# Patient Record
Sex: Female | Born: 1955 | Race: Black or African American | Hispanic: No | Marital: Single | Smoking: Current every day smoker
Health system: Southern US, Community
[De-identification: ages and names within clinical notes are randomized; demographics above are authoritative.]

## PROBLEM LIST (undated history)

## (undated) DIAGNOSIS — I10 Essential (primary) hypertension: Secondary | ICD-10-CM

---

## 2005-02-12 ENCOUNTER — Ambulatory Visit: Payer: Self-pay | Admitting: General Practice

## 2006-01-14 ENCOUNTER — Ambulatory Visit: Payer: Self-pay | Admitting: Family Medicine

## 2007-01-27 ENCOUNTER — Ambulatory Visit: Payer: Self-pay | Admitting: Family Medicine

## 2008-04-12 ENCOUNTER — Ambulatory Visit: Payer: Self-pay | Admitting: Unknown Physician Specialty

## 2008-04-26 ENCOUNTER — Ambulatory Visit: Payer: Self-pay

## 2008-05-12 ENCOUNTER — Ambulatory Visit: Payer: Self-pay | Admitting: Internal Medicine

## 2009-08-06 ENCOUNTER — Ambulatory Visit: Payer: Self-pay

## 2010-08-08 ENCOUNTER — Ambulatory Visit: Payer: Self-pay

## 2010-08-17 ENCOUNTER — Ambulatory Visit: Payer: Self-pay | Admitting: Internal Medicine

## 2010-08-19 ENCOUNTER — Emergency Department: Payer: Self-pay | Admitting: Emergency Medicine

## 2011-08-19 ENCOUNTER — Ambulatory Visit: Payer: Self-pay

## 2012-08-19 ENCOUNTER — Ambulatory Visit: Payer: Self-pay

## 2013-07-01 ENCOUNTER — Ambulatory Visit: Payer: Self-pay | Admitting: Unknown Physician Specialty

## 2013-07-04 LAB — PATHOLOGY REPORT

## 2013-09-20 ENCOUNTER — Ambulatory Visit: Payer: Self-pay

## 2013-12-28 ENCOUNTER — Encounter: Payer: Self-pay | Admitting: Podiatry

## 2013-12-28 ENCOUNTER — Ambulatory Visit (INDEPENDENT_AMBULATORY_CARE_PROVIDER_SITE_OTHER): Payer: BC Managed Care – PPO | Admitting: Podiatry

## 2013-12-28 VITALS — BP 112/69 | HR 93 | Resp 16 | Ht 66.0 in | Wt 174.0 lb

## 2013-12-28 DIAGNOSIS — M79609 Pain in unspecified limb: Secondary | ICD-10-CM

## 2013-12-28 DIAGNOSIS — B351 Tinea unguium: Secondary | ICD-10-CM

## 2013-12-28 DIAGNOSIS — M79676 Pain in unspecified toe(s): Secondary | ICD-10-CM

## 2013-12-28 NOTE — Progress Notes (Signed)
She presents today with a chief complaint of painful elongated toenails.  Objective: Nails are thick yellow dystrophic onychomycotic and painful palpation.  Assessment: Pain in limb secondary onychomycosis 1-5 bilateral.  Plan: Debridement of nails 1 through 5 bilateral covered service secondary to pain.

## 2014-06-05 ENCOUNTER — Ambulatory Visit (INDEPENDENT_AMBULATORY_CARE_PROVIDER_SITE_OTHER): Payer: BLUE CROSS/BLUE SHIELD | Admitting: Podiatry

## 2014-06-05 ENCOUNTER — Encounter: Payer: Self-pay | Admitting: Podiatry

## 2014-06-05 VITALS — BP 105/76 | HR 84 | Resp 12

## 2014-06-05 DIAGNOSIS — B351 Tinea unguium: Secondary | ICD-10-CM

## 2014-06-05 DIAGNOSIS — M79676 Pain in unspecified toe(s): Secondary | ICD-10-CM

## 2014-06-05 NOTE — Progress Notes (Signed)
She presents today with chief complaint of painful elongated toenails.  Objective: Pulses are palpable bilateral. Nails are thick yellow dystrophic with mycotic and painful palpation.  Assessment: Pain in limb segment onychomycosis 1 through 5 bilateral.  Plan: Debridement of nails 1 through 5 bilateral.

## 2014-07-05 DIAGNOSIS — F17200 Nicotine dependence, unspecified, uncomplicated: Secondary | ICD-10-CM | POA: Insufficient documentation

## 2014-07-05 DIAGNOSIS — R7989 Other specified abnormal findings of blood chemistry: Secondary | ICD-10-CM | POA: Insufficient documentation

## 2014-07-05 DIAGNOSIS — E559 Vitamin D deficiency, unspecified: Secondary | ICD-10-CM | POA: Insufficient documentation

## 2014-08-28 ENCOUNTER — Ambulatory Visit (INDEPENDENT_AMBULATORY_CARE_PROVIDER_SITE_OTHER): Payer: BLUE CROSS/BLUE SHIELD | Admitting: Podiatry

## 2014-08-28 DIAGNOSIS — M79676 Pain in unspecified toe(s): Secondary | ICD-10-CM

## 2014-08-28 DIAGNOSIS — M7752 Other enthesopathy of left foot: Secondary | ICD-10-CM | POA: Diagnosis not present

## 2014-08-28 DIAGNOSIS — B351 Tinea unguium: Secondary | ICD-10-CM | POA: Diagnosis not present

## 2014-08-28 NOTE — Progress Notes (Signed)
She presents today with chief complaint of painful elongated toenails.  Objective: Pulses are palpable bilateral. Nails are thick yellow dystrophic with mycotic and painful palpation.  Assessment: Pain in limb segment onychomycosis 1 through 5 bilateral.  Plan: Debridement of nails 1 through 5 bilateral.

## 2014-10-30 ENCOUNTER — Other Ambulatory Visit: Payer: Self-pay | Admitting: Obstetrics and Gynecology

## 2014-10-30 DIAGNOSIS — Z1231 Encounter for screening mammogram for malignant neoplasm of breast: Secondary | ICD-10-CM

## 2014-11-01 ENCOUNTER — Ambulatory Visit
Admission: RE | Admit: 2014-11-01 | Discharge: 2014-11-01 | Disposition: A | Discharge status: Home / Self Care | Payer: BLUE CROSS/BLUE SHIELD | Source: Ambulatory Visit | Attending: Obstetrics and Gynecology | Admitting: Obstetrics and Gynecology

## 2014-11-01 DIAGNOSIS — Z1231 Encounter for screening mammogram for malignant neoplasm of breast: Secondary | ICD-10-CM | POA: Insufficient documentation

## 2015-07-04 ENCOUNTER — Ambulatory Visit (INDEPENDENT_AMBULATORY_CARE_PROVIDER_SITE_OTHER): Payer: BLUE CROSS/BLUE SHIELD | Admitting: Podiatry

## 2015-07-04 ENCOUNTER — Encounter: Payer: Self-pay | Admitting: Podiatry

## 2015-07-04 DIAGNOSIS — B351 Tinea unguium: Secondary | ICD-10-CM

## 2015-07-04 DIAGNOSIS — M79676 Pain in unspecified toe(s): Secondary | ICD-10-CM | POA: Diagnosis not present

## 2015-07-04 DIAGNOSIS — E785 Hyperlipidemia, unspecified: Secondary | ICD-10-CM | POA: Insufficient documentation

## 2015-07-04 NOTE — Progress Notes (Signed)
She presents today after having not been here for 1 year. Complaining of painful elongated toenails 1 through 5 bilateral.  Objective: Vital signs are stable alert and oriented 3. Pulses are strongly palpable. Neurologic sensorium is intact. Toenails are thick yellow dystrophic severely elongated painful on palpation as well as debridement.  Assessment: Pain in limb secondary to onychomycosis.  Plan: Debridement of toenails 1 through 5 bilateral.

## 2015-11-07 ENCOUNTER — Encounter: Payer: BLUE CROSS/BLUE SHIELD | Admitting: Podiatry

## 2015-11-07 ENCOUNTER — Encounter: Payer: Self-pay | Admitting: Podiatry

## 2015-11-07 NOTE — Progress Notes (Signed)
This encounter was created in error - please disregard.

## 2015-11-21 ENCOUNTER — Other Ambulatory Visit: Payer: Self-pay | Admitting: Internal Medicine

## 2015-11-21 DIAGNOSIS — Z1231 Encounter for screening mammogram for malignant neoplasm of breast: Secondary | ICD-10-CM

## 2015-11-28 ENCOUNTER — Ambulatory Visit
Admission: RE | Admit: 2015-11-28 | Discharge: 2015-11-28 | Disposition: A | Discharge status: Home / Self Care | Payer: BLUE CROSS/BLUE SHIELD | Source: Ambulatory Visit | Attending: Internal Medicine | Admitting: Internal Medicine

## 2015-11-28 ENCOUNTER — Other Ambulatory Visit: Payer: Self-pay | Admitting: Internal Medicine

## 2015-11-28 DIAGNOSIS — Z1231 Encounter for screening mammogram for malignant neoplasm of breast: Secondary | ICD-10-CM

## 2015-12-31 DIAGNOSIS — J302 Other seasonal allergic rhinitis: Secondary | ICD-10-CM | POA: Insufficient documentation

## 2016-06-23 ENCOUNTER — Ambulatory Visit (INDEPENDENT_AMBULATORY_CARE_PROVIDER_SITE_OTHER): Payer: BLUE CROSS/BLUE SHIELD | Admitting: Podiatry

## 2016-06-23 DIAGNOSIS — B351 Tinea unguium: Secondary | ICD-10-CM

## 2016-06-23 DIAGNOSIS — M79676 Pain in unspecified toe(s): Secondary | ICD-10-CM | POA: Diagnosis not present

## 2016-06-23 NOTE — Progress Notes (Signed)
She presents today to complaint of painful elongated toenails.  Objective: The nose along thick yellow dystrophic onychomycotic and painful palpation.  Assessment: Limb segment onychomycosis.  Plan: Debridement of toenails 1 through 5 bilateral.

## 2016-10-17 ENCOUNTER — Other Ambulatory Visit: Payer: Self-pay | Admitting: Internal Medicine

## 2016-10-27 ENCOUNTER — Ambulatory Visit (INDEPENDENT_AMBULATORY_CARE_PROVIDER_SITE_OTHER): Payer: BLUE CROSS/BLUE SHIELD | Admitting: Podiatry

## 2016-10-27 ENCOUNTER — Encounter: Payer: Self-pay | Admitting: Podiatry

## 2016-10-27 DIAGNOSIS — M79676 Pain in unspecified toe(s): Secondary | ICD-10-CM | POA: Diagnosis not present

## 2016-10-27 DIAGNOSIS — B351 Tinea unguium: Secondary | ICD-10-CM

## 2016-10-27 NOTE — Progress Notes (Signed)
She presents today to complaint of painful elongated toenails. She is also complaining of pain to her left heel.  Objective: Vital signs are stable she's alert and oriented 3. Pulses are palpable. No calf pain. She has pain on palpation medial calcaneal tubercle of the left heel. Toenails are long thick yellow dystrophic onychomycotic.  Assessment: Pain and limp secondary to onychomycosis and plantar fasciitis.  Plan: I offered her an injection for the plantar fascia pain however she declined. Debridement of toenails 1 through 5 bilateral. Follow up with her in 6 months

## 2017-01-06 ENCOUNTER — Other Ambulatory Visit: Payer: Self-pay | Admitting: Internal Medicine

## 2017-01-06 DIAGNOSIS — Z1231 Encounter for screening mammogram for malignant neoplasm of breast: Secondary | ICD-10-CM

## 2017-02-10 ENCOUNTER — Ambulatory Visit
Admission: RE | Admit: 2017-02-10 | Discharge: 2017-02-10 | Disposition: A | Discharge status: Home / Self Care | Payer: BLUE CROSS/BLUE SHIELD | Source: Ambulatory Visit | Attending: Internal Medicine | Admitting: Internal Medicine

## 2017-02-10 DIAGNOSIS — Z1231 Encounter for screening mammogram for malignant neoplasm of breast: Secondary | ICD-10-CM | POA: Insufficient documentation

## 2017-04-22 ENCOUNTER — Ambulatory Visit (INDEPENDENT_AMBULATORY_CARE_PROVIDER_SITE_OTHER): Payer: BLUE CROSS/BLUE SHIELD | Admitting: Podiatry

## 2017-04-22 ENCOUNTER — Encounter: Payer: Self-pay | Admitting: Podiatry

## 2017-04-22 DIAGNOSIS — M79676 Pain in unspecified toe(s): Secondary | ICD-10-CM | POA: Diagnosis not present

## 2017-04-22 DIAGNOSIS — B351 Tinea unguium: Secondary | ICD-10-CM | POA: Diagnosis not present

## 2017-04-22 DIAGNOSIS — M722 Plantar fascial fibromatosis: Secondary | ICD-10-CM

## 2017-04-22 NOTE — Progress Notes (Signed)
She presents today chief complaint of pain to her right heel.  She is also complaining of painful elongated toenails.  Objective: Vital signs are stable she is alert and oriented x3 toenails are long thick yellow dystrophic clinically mycotic and painful on palpation.  She has pain to palpation medial calcaneal tubercle of the right heel.  No open lesions or wounds are noted.  Assessment: Pain in limb secondary to onychomycosis and plantar fasciitis.  Plan: Injected the right heel today after verbal permission with 20 mg of Kenalog and 5 mg of Marcaine after sterile Betadine skin prep to the medial aspect of the right heel tolerated procedure well with no complications.  I also debrided toenails 1 through 5 bilateral cover service secondary to pain.  Follow-up with her as needed.

## 2017-04-29 ENCOUNTER — Ambulatory Visit: Payer: BLUE CROSS/BLUE SHIELD | Admitting: Podiatry

## 2017-12-08 ENCOUNTER — Ambulatory Visit: Payer: BLUE CROSS/BLUE SHIELD | Admitting: Podiatry

## 2017-12-28 ENCOUNTER — Ambulatory Visit (INDEPENDENT_AMBULATORY_CARE_PROVIDER_SITE_OTHER): Payer: BLUE CROSS/BLUE SHIELD | Admitting: Podiatry

## 2017-12-28 ENCOUNTER — Encounter: Payer: Self-pay | Admitting: Podiatry

## 2017-12-28 DIAGNOSIS — B351 Tinea unguium: Secondary | ICD-10-CM

## 2017-12-28 DIAGNOSIS — M79676 Pain in unspecified toe(s): Secondary | ICD-10-CM | POA: Diagnosis not present

## 2017-12-28 NOTE — Progress Notes (Signed)
She presents today chief concern of painful elongated toenails.  Objective: Toenails are long thick yellow dystrophic with mycotic pulses are palpable.  No open lesions or wounds.  No erythema edema cellulitis drainage or odor.  Assessment: Pain in limb secondary to onychomycosis.  Plan: Debridement of toenails 1 through 5 bilateral.

## 2018-01-04 ENCOUNTER — Other Ambulatory Visit: Payer: Self-pay | Admitting: Internal Medicine

## 2018-01-04 DIAGNOSIS — Z1231 Encounter for screening mammogram for malignant neoplasm of breast: Secondary | ICD-10-CM

## 2018-02-18 ENCOUNTER — Ambulatory Visit
Admission: RE | Admit: 2018-02-18 | Discharge: 2018-02-18 | Disposition: A | Discharge status: Home / Self Care | Payer: BLUE CROSS/BLUE SHIELD | Source: Ambulatory Visit | Attending: Internal Medicine | Admitting: Internal Medicine

## 2018-02-18 DIAGNOSIS — Z1231 Encounter for screening mammogram for malignant neoplasm of breast: Secondary | ICD-10-CM | POA: Insufficient documentation

## 2018-04-26 ENCOUNTER — Ambulatory Visit: Payer: BLUE CROSS/BLUE SHIELD | Admitting: Podiatry

## 2018-09-20 ENCOUNTER — Encounter: Payer: Self-pay | Admitting: Podiatry

## 2018-09-20 ENCOUNTER — Other Ambulatory Visit: Payer: Self-pay

## 2018-09-20 ENCOUNTER — Ambulatory Visit: Payer: BLUE CROSS/BLUE SHIELD

## 2018-09-20 ENCOUNTER — Ambulatory Visit (INDEPENDENT_AMBULATORY_CARE_PROVIDER_SITE_OTHER): Payer: BLUE CROSS/BLUE SHIELD | Admitting: Podiatry

## 2018-09-20 VITALS — Temp 98.5°F

## 2018-09-20 DIAGNOSIS — Q828 Other specified congenital malformations of skin: Secondary | ICD-10-CM | POA: Diagnosis not present

## 2018-09-20 DIAGNOSIS — M79676 Pain in unspecified toe(s): Secondary | ICD-10-CM | POA: Diagnosis not present

## 2018-09-20 DIAGNOSIS — M722 Plantar fascial fibromatosis: Secondary | ICD-10-CM

## 2018-09-20 DIAGNOSIS — B351 Tinea unguium: Secondary | ICD-10-CM | POA: Diagnosis not present

## 2018-09-20 NOTE — Progress Notes (Signed)
She presents today with a chief complaint of painfully elongated toenails and multiple calluses to the plantar aspect of the right foot.  Objective: Vital signs are stable she is alert and oriented x3.  Pulses are palpable.  No open lesions or wounds multiple porokeratotic lesions along the plantar lateral arch and forefoot right primarily.  Nails are thick yellow dystrophic onychomycotic painfully ingrown.  Assessment: Porokeratosis right foot.  Pain in limb secondary to onychomycosis.  Plan: Debridement of toenails and all porokeratotic tissue bilaterally.  Follow-up with me as needed.

## 2018-09-30 DIAGNOSIS — Z86010 Personal history of colonic polyps: Secondary | ICD-10-CM | POA: Insufficient documentation

## 2018-11-01 ENCOUNTER — Emergency Department
Admission: EM | Admit: 2018-11-01 | Discharge: 2018-11-01 | Disposition: A | Discharge status: Home / Self Care | Payer: BC Managed Care – PPO | Attending: Emergency Medicine | Admitting: Emergency Medicine

## 2018-11-01 ENCOUNTER — Emergency Department: Payer: BC Managed Care – PPO

## 2018-11-01 ENCOUNTER — Other Ambulatory Visit: Payer: Self-pay

## 2018-11-01 ENCOUNTER — Encounter: Payer: Self-pay | Admitting: Emergency Medicine

## 2018-11-01 DIAGNOSIS — Z7982 Long term (current) use of aspirin: Secondary | ICD-10-CM | POA: Diagnosis not present

## 2018-11-01 DIAGNOSIS — Z79899 Other long term (current) drug therapy: Secondary | ICD-10-CM | POA: Insufficient documentation

## 2018-11-01 DIAGNOSIS — F17210 Nicotine dependence, cigarettes, uncomplicated: Secondary | ICD-10-CM | POA: Insufficient documentation

## 2018-11-01 DIAGNOSIS — H5461 Unqualified visual loss, right eye, normal vision left eye: Secondary | ICD-10-CM | POA: Insufficient documentation

## 2018-11-01 DIAGNOSIS — E119 Type 2 diabetes mellitus without complications: Secondary | ICD-10-CM | POA: Insufficient documentation

## 2018-11-01 DIAGNOSIS — H538 Other visual disturbances: Secondary | ICD-10-CM | POA: Diagnosis present

## 2018-11-01 HISTORY — DX: Essential (primary) hypertension: I10

## 2018-11-01 LAB — DIFFERENTIAL
Abs Immature Granulocytes: 0.02 10*3/uL (ref 0.00–0.07)
Basophils Absolute: 0 10*3/uL (ref 0.0–0.1)
Basophils Relative: 1 %
Eosinophils Absolute: 0.2 10*3/uL (ref 0.0–0.5)
Eosinophils Relative: 3 %
Immature Granulocytes: 0 %
Lymphocytes Relative: 37 %
Lymphs Abs: 2.8 10*3/uL (ref 0.7–4.0)
Monocytes Absolute: 0.5 10*3/uL (ref 0.1–1.0)
Monocytes Relative: 7 %
Neutro Abs: 3.9 10*3/uL (ref 1.7–7.7)
Neutrophils Relative %: 52 %

## 2018-11-01 LAB — CBC
HCT: 43 % (ref 36.0–46.0)
Hemoglobin: 14.8 g/dL (ref 12.0–15.0)
MCH: 30.2 pg (ref 26.0–34.0)
MCHC: 34.4 g/dL (ref 30.0–36.0)
MCV: 87.8 fL (ref 80.0–100.0)
Platelets: 215 10*3/uL (ref 150–400)
RBC: 4.9 MIL/uL (ref 3.87–5.11)
RDW: 12.7 % (ref 11.5–15.5)
WBC: 7.4 10*3/uL (ref 4.0–10.5)
nRBC: 0 % (ref 0.0–0.2)

## 2018-11-01 LAB — GLUCOSE, CAPILLARY: Glucose-Capillary: 109 mg/dL — ABNORMAL HIGH (ref 70–99)

## 2018-11-01 LAB — COMPREHENSIVE METABOLIC PANEL
ALT: 19 U/L (ref 0–44)
AST: 23 U/L (ref 15–41)
Albumin: 4.3 g/dL (ref 3.5–5.0)
Alkaline Phosphatase: 61 U/L (ref 38–126)
Anion gap: 8 (ref 5–15)
BUN: 12 mg/dL (ref 6–20)
CO2: 26 mmol/L (ref 22–32)
Calcium: 9.9 mg/dL (ref 8.9–10.3)
Chloride: 109 mmol/L (ref 98–111)
Creatinine, Ser: 0.61 mg/dL (ref 0.44–1.00)
GFR calc Af Amer: >60 mL/min (ref >60)
GFR calc non Af Amer: >60 mL/min (ref >60)
Glucose, Bld: 130 mg/dL — ABNORMAL HIGH (ref 70–99)
Potassium: 3.8 mmol/L (ref 3.5–5.1)
Sodium: 143 mmol/L (ref 135–145)
Total Bilirubin: 0.4 mg/dL (ref 0.3–1.2)
Total Protein: 7.1 g/dL (ref 6.5–8.1)

## 2018-11-01 LAB — PROTIME-INR
INR: 1 (ref 0.8–1.2)
Prothrombin Time: 12.7 seconds (ref 11.4–15.2)

## 2018-11-01 LAB — APTT: aPTT: 29 seconds (ref 24–36)

## 2018-11-01 NOTE — Discharge Instructions (Addendum)
Please proceed to [facility] for further evaluation.  Please inform them that you were seen in the emergency department today and referred to the [facility] by Dr. George Ina.

## 2018-11-01 NOTE — ED Notes (Signed)
Tv turned on for patient

## 2018-11-01 NOTE — ED Notes (Signed)
Rainbow was sent.

## 2018-11-01 NOTE — ED Notes (Signed)
Urine cup with bag was given to pt.

## 2018-11-01 NOTE — ED Triage Notes (Signed)
Says has had blurred vision in right eye since Saturday night.  Says her left eye has a cataract, so she does not seee difference there..she said there were no other symptoms.

## 2018-11-01 NOTE — ED Provider Notes (Signed)
[facility] Emergency Department Provider Note  Time seen: 11:27 AM  I have reviewed the triage vital signs and the nursing notes.   HISTORY  Chief Complaint Loss of Vision   HPI Victoria Boyd is a 63 y.o. female with a past medical history of hypertension, hyperlipidemia, presents to the emergency department for decreased vision from her right eye.  According to the patient since yesterday she has had diminished vision to her right eye.  Patient describes as a falling over the right eye like she is looking through dirty eyeglasses.  Patient states the cataract to the left eye but denies any visual changes to the left eye from baseline.  Patient denies any weakness or numbness confusion or slurred speech.  Patient states she continued to have diminished vision today, had a blood pressure checked at work and it was elevated so she came to the emergency department for evaluation.  Denies any chest pain, denies any cough congestion shortness of breath or fever.   Past Medical History:  Diagnosis Date  . Hypertension     Patient Active Problem List   Diagnosis Date Noted  . Chronic seasonal allergic rhinitis 12/31/2015  . HLD (hyperlipidemia) 07/04/2015  . Vitamin D deficiency 07/05/2014  . 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 Low vitamin D level 07/05/2014  . Tobacco use disorder 07/05/2014    History reviewed. No pertinent surgical history.  Prior to Admission medications   Medication Sig Start Date End Date Taking? Authorizing Provider  aspirin EC 81 MG tablet Take by mouth.    [provider]  Cholecalciferol (VITAMIN D3) 2000 UNITS capsule Take by mouth.    [provider]  fluticasone (FLONASE) 50 MCG/ACT nasal spray Place into the nose. 09/02/13   [provider]  furosemide (LASIX) 20 MG tablet Take 20 mg by mouth daily. 09/13/18   [provider]  montelukast (SINGULAIR) 10 MG tablet Take by mouth. 03/08/14    [provider]  Multiple Vitamin (MULTI-VITAMINS) TABS Take by mouth.    [provider]  simvastatin (ZOCOR) 40 MG tablet Take by mouth. 09/02/13   [provider]    Allergies  Allergen Reactions  . Penicillins Swelling    Family History  Problem Relation Age of Onset  . Breast cancer Neg Hx     Social History Social History   Tobacco Use  . Smoking status: Current Every Day Smoker  . Smokeless tobacco: Never Used  Substance Use Topics  . Alcohol use: No  . Drug use: Not on file    Review of Systems Constitutional: Negative for fever. Eyes: Diminished vision/fog over her vision of the right eye.  Left eye cataract which is baseline for the patient. Cardiovascular: Negative for chest pain. Respiratory: Negative for shortness of breath.  Negative for cough. Gastrointestinal: Negative for abdominal pain Musculoskeletal: Negative for musculoskeletal complaints Skin: Negative for skin complaints  Neurological: Negative for headache All other ROS negative  ____________________________________________   PHYSICAL EXAM:  VITAL SIGNS: ED Triage Vitals  Enc Vitals Group     BP 11/01/18 1039 (!) 144/92     Pulse Rate 11/01/18 1039 (!) 108     Resp 11/01/18 1039 20     Temp 11/01/18 1039 98.2 F (36.8 C)     Temp Source 11/01/18 1039 Oral     SpO2 11/01/18 1039 97 %     Weight 11/01/18 1039 177 lb (80.3 kg)     Height 11/01/18 1039  5\' 6"  (1.676 m)     Head Circumference --      Peak Flow --      Pain Score 11/01/18 1042 0     Pain Loc --      Pain Edu? --      Excl. in [place]? --    Constitutional: Alert and oriented. Well appearing and in no distress. Eyes: Normal exam, extraocular muscles intact.  PERRLA.  Visual fields intact ENT      Head: Normocephalic and atraumatic.      Mouth/Throat: Mucous membranes are moist. Cardiovascular: Normal rate, regular rhythm Respiratory: Normal respiratory effort without tachypnea nor retractions.  Breath sounds are clear  Gastrointestinal: Soft and nontender. No distention.   Musculoskeletal: Nontender with normal range of motion in all extremities.  Neurologic:  Normal speech and language. No gross focal neurologic deficits.  Equal grip strength bilaterally.  5/5 motor in all extremities.  No lower or upper extremity drift Skin:  Skin is warm, dry and intact.  Psychiatric: Mood and affect are normal  ____________________________________________    EKG  EKG viewed and interpreted by myself shows a normal sinus rhythm at 97 bpm with a narrow QRS, mild left axis deviation, largely normal intervals, nonspecific but no concerning ST changes.  ____________________________________________    RADIOLOGY  CT head negative  ____________________________________________   INITIAL IMPRESSION / ASSESSMENT AND PLAN / ED COURSE  Pertinent labs & imaging results that were available during my care of the patient were reviewed by me and considered in my medical decision making (see chart for details).   Patient presents to the emergency department for decreased vision from her right eye and high blood pressure today.  Overall the patient appears well, states she can see out of the right eye but it feels like she is looking through dirty eyeglasses.  Patient states she sees [facility].  Denies any weakness or numbness confusion or slurred speech.  Neurological exam is intact.  Patient has extraocular muscles intact, PERRLA, no visual field cuts.  Patient denies any eye pain.  Differential would include cataract, lens disruption, vitreous hemorrhage, retinal detachment, less likely glaucoma given no tenderness or pain.  We will discuss with [facility] with whom the patient is already established for further recommendations and hopeful eye exam today.  Patient's overall work-up is reassuring including negative CT scan normal labs and reassuring EKG.  I discussed the patient with Dr.  George Ina of [facility].  States he believes the patient will be safe to follow-up in the office this afternoon.  Patient will go directly to [facility] office for further evaluation.  Patient feels comfortable with this plan of care.  Victoria Boyd was evaluated in Emergency Department on 11/01/2018 for the symptoms described in the history of present illness. She was evaluated in the context of the global COVID-19 pandemic, which necessitated consideration that the patient might be at risk for infection with the SARS-CoV-2 virus that causes COVID-19. Institutional protocols and algorithms that pertain to the evaluation of patients at risk for COVID-19 are in a state of rapid change based on information released by regulatory bodies including the CDC and federal and state organizations. These policies and algorithms were followed during the patient's care in the ED.  ____________________________________________   FINAL CLINICAL IMPRESSION(S) / ED DIAGNOSES  Visual deficit   Harvest Dark, MD 11/01/18 1302

## 2018-11-11 ENCOUNTER — Other Ambulatory Visit: Payer: Self-pay | Admitting: Ophthalmology

## 2018-11-11 DIAGNOSIS — H4922 Sixth [abducent] nerve palsy, left eye: Secondary | ICD-10-CM

## 2018-11-15 ENCOUNTER — Ambulatory Visit: Payer: BC Managed Care – PPO

## 2018-11-21 ENCOUNTER — Ambulatory Visit
Admission: RE | Admit: 2018-11-21 | Discharge: 2018-11-21 | Disposition: A | Discharge status: Home / Self Care | Payer: BC Managed Care – PPO | Source: Ambulatory Visit | Attending: Ophthalmology | Admitting: Ophthalmology

## 2018-11-21 DIAGNOSIS — H4922 Sixth [abducent] nerve palsy, left eye: Secondary | ICD-10-CM

## 2018-11-21 MED ORDER — GADOBUTROL 1 MMOL/ML IV SOLN
7.0000 mL | Freq: Once | INTRAVENOUS | Status: AC | PRN
  Administered 2018-11-21: 14:00:00 7 mL via INTRAVENOUS

## 2019-01-11 DIAGNOSIS — I10 Essential (primary) hypertension: Secondary | ICD-10-CM | POA: Insufficient documentation

## 2019-01-19 ENCOUNTER — Other Ambulatory Visit: Payer: Self-pay | Admitting: Internal Medicine

## 2019-01-19 DIAGNOSIS — Z1231 Encounter for screening mammogram for malignant neoplasm of breast: Secondary | ICD-10-CM

## 2019-02-21 ENCOUNTER — Other Ambulatory Visit: Payer: Self-pay

## 2019-02-21 ENCOUNTER — Ambulatory Visit
Admission: RE | Admit: 2019-02-21 | Discharge: 2019-02-21 | Disposition: A | Discharge status: Home / Self Care | Payer: BC Managed Care – PPO | Source: Ambulatory Visit | Attending: Internal Medicine | Admitting: Internal Medicine

## 2019-02-21 DIAGNOSIS — Z1231 Encounter for screening mammogram for malignant neoplasm of breast: Secondary | ICD-10-CM | POA: Diagnosis not present

## 2019-03-16 ENCOUNTER — Other Ambulatory Visit: Payer: Self-pay

## 2019-03-16 ENCOUNTER — Ambulatory Visit (INDEPENDENT_AMBULATORY_CARE_PROVIDER_SITE_OTHER): Payer: BC Managed Care – PPO | Admitting: Podiatry

## 2019-03-16 DIAGNOSIS — M722 Plantar fascial fibromatosis: Secondary | ICD-10-CM

## 2019-03-16 DIAGNOSIS — M79676 Pain in unspecified toe(s): Secondary | ICD-10-CM

## 2019-03-16 DIAGNOSIS — Q828 Other specified congenital malformations of skin: Secondary | ICD-10-CM | POA: Diagnosis not present

## 2019-03-16 DIAGNOSIS — B351 Tinea unguium: Secondary | ICD-10-CM

## 2019-03-16 NOTE — Progress Notes (Signed)
She presents today chief complaint of a painful heel right.  She states that she will get that shot back in March when it for started hurting but I am scared to do it.  Also need my toenails trimmed and my calluses trimmed.  Objective: Vital signs are stable alert and oriented x3.  Pulses are palpable.  She has severe pain on palpation medial located tubercle of the right heel.  No pain on medial and lateral compression of the calcaneus.  Toenails are long thick yellow dystrophic clinically mycotic painful palpation painful on debridement.  She also has multiple porokeratotic lesions the majority of which lie on the lateral aspect of the fifth metatarsal area of the right foot plantarly.  Assessment: Pain in limb secondary to plantar fasciitis, onychomycosis and poor keratomas.  Plan: Discussed etiology pathology conservative versus surgical therapies.  At this point I injected 20 mg Kenalog 5 mg Marcaine point of maximal tenderness of the right heel after sterile Betadine skin prep she tolerated procedure well without complications.  Debrided toenails 1 through 5 bilaterally.  Debrided all hypertrophic hyperkeratotic tissue.  Follow-up with me as needed.

## 2019-04-13 ENCOUNTER — Encounter: Payer: Self-pay | Admitting: Podiatry

## 2019-04-13 ENCOUNTER — Other Ambulatory Visit: Payer: Self-pay

## 2019-04-13 ENCOUNTER — Ambulatory Visit (INDEPENDENT_AMBULATORY_CARE_PROVIDER_SITE_OTHER): Payer: BC Managed Care – PPO | Admitting: Podiatry

## 2019-04-13 DIAGNOSIS — M722 Plantar fascial fibromatosis: Secondary | ICD-10-CM | POA: Diagnosis not present

## 2019-04-13 NOTE — Progress Notes (Signed)
She presents today for follow-up of her bilateral heels states that the right was doing much better than it was but the left one is killing me now.  Objective: Vital signs are stable alert and oriented x3.  Pulses are palpable.  There is no erythema edema cellulitis drainage or odor she has pain on palpation medial calcaneal tubercle left greater than right.  Assessment: Pain in limb secondary to plantar fasciitis left greater than right.  Plan: After sterile Betadine skin prep I injected 20 mg Kenalog 5 mg Marcaine point of maximal tenderness bilateral heels.  She states that she was going to find some over-the-counter insoles.  She does not want custom orthotics because insurance does not pay for those.

## 2019-04-20 ENCOUNTER — Other Ambulatory Visit: Payer: Self-pay

## 2019-04-20 ENCOUNTER — Emergency Department
Admission: EM | Admit: 2019-04-20 | Discharge: 2019-04-20 | Disposition: A | Discharge status: Home / Self Care | Payer: BC Managed Care – PPO | Attending: Emergency Medicine | Admitting: Emergency Medicine

## 2019-04-20 ENCOUNTER — Encounter: Payer: Self-pay | Admitting: Medical Oncology

## 2019-04-20 ENCOUNTER — Emergency Department: Payer: BC Managed Care – PPO

## 2019-04-20 DIAGNOSIS — I10 Essential (primary) hypertension: Secondary | ICD-10-CM | POA: Diagnosis not present

## 2019-04-20 DIAGNOSIS — F17210 Nicotine dependence, cigarettes, uncomplicated: Secondary | ICD-10-CM | POA: Diagnosis not present

## 2019-04-20 DIAGNOSIS — R002 Palpitations: Secondary | ICD-10-CM

## 2019-04-20 LAB — BASIC METABOLIC PANEL
Anion gap: 10 (ref 5–15)
BUN: 17 mg/dL (ref 8–23)
CO2: 27 mmol/L (ref 22–32)
Calcium: 10.1 mg/dL (ref 8.9–10.3)
Chloride: 101 mmol/L (ref 98–111)
Creatinine, Ser: 0.74 mg/dL (ref 0.44–1.00)
GFR calc Af Amer: >60 mL/min (ref >60)
GFR calc non Af Amer: >60 mL/min (ref >60)
Glucose, Bld: 112 mg/dL — ABNORMAL HIGH (ref 70–99)
Potassium: 4 mmol/L (ref 3.5–5.1)
Sodium: 138 mmol/L (ref 135–145)

## 2019-04-20 LAB — CBC
HCT: 46.5 % — ABNORMAL HIGH (ref 36.0–46.0)
Hemoglobin: 15.9 g/dL — ABNORMAL HIGH (ref 12.0–15.0)
MCH: 29.9 pg (ref 26.0–34.0)
MCHC: 34.2 g/dL (ref 30.0–36.0)
MCV: 87.6 fL (ref 80.0–100.0)
Platelets: 249 10*3/uL (ref 150–400)
RBC: 5.31 MIL/uL — ABNORMAL HIGH (ref 3.87–5.11)
RDW: 12.5 % (ref 11.5–15.5)
WBC: 9.5 10*3/uL (ref 4.0–10.5)
nRBC: 0 % (ref 0.0–0.2)

## 2019-04-20 LAB — TROPONIN I (HIGH SENSITIVITY): Troponin I (High Sensitivity): 4 ng/L (ref <18)

## 2019-04-20 MED ORDER — LORAZEPAM 0.5 MG PO TABS: 0.5000 mg | ORAL_TABLET | Freq: Two times a day (BID) | ORAL | 0 refills | Status: AC | PRN

## 2019-04-20 NOTE — ED Notes (Signed)
Pt resting in the bed, denies need for blanket, Denies any palpitations at this time, states she had been stressed about not receiving the stimulus check yet and thinks that is when her heart began "fluttering." Bed locked and low, call light in reach.

## 2019-04-20 NOTE — ED Triage Notes (Signed)
Pt reports since last night she has been feeling a "flutter" feeling in her chest. Denies chest pain.

## 2019-04-20 NOTE — ED Provider Notes (Signed)
[facility] Emergency Department Provider Note       Time seen: ----------------------------------------- 7:01 AM on 04/20/2019 -----------------------------------------   I have reviewed the triage vital signs and the nursing notes.  HISTORY   Chief Complaint Palpitations    HPI Victoria Boyd is a 64 y.o. female with a history of hypertension, hyperlipidemia who presents to the ED for palpitations or a fluttering feeling in her chest.  Patient states she has been feeling this since last night.  She denies any pain.  She denies any recent illness.  Past Medical History:  Diagnosis Date  . Hypertension     Patient Active Problem List   Diagnosis Date Noted  . Chronic seasonal allergic rhinitis 12/31/2015  . HLD (hyperlipidemia) 07/04/2015  . Vitamin D deficiency 07/05/2014  . 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 Low vitamin D level 07/05/2014  . Tobacco use disorder 07/05/2014    History reviewed. No pertinent surgical history.  Allergies Penicillins  Social History Social History   Tobacco Use  . Smoking status: Current Every Day Smoker  . Smokeless tobacco: Never Used  Substance Use Topics  . Alcohol use: No  . Drug use: Not on file    Review of Systems Constitutional: Negative for fever. Cardiovascular: Negative for chest pain.  Positive for palpitations Respiratory: Negative for shortness of breath. Gastrointestinal: Negative for abdominal pain, vomiting and diarrhea. Musculoskeletal: Negative for back pain. Skin: Negative for rash. Neurological: Negative for headaches, focal weakness or numbness.  All systems negative/normal/unremarkable except as stated in the HPI  ____________________________________________   PHYSICAL EXAM:  VITAL SIGNS: ED Triage Vitals  Enc Vitals Group     BP 04/20/19 0634 (!) 158/99     Pulse Rate 04/20/19 0634 91     Resp 04/20/19 0634 18     Temp 04/20/19 0634 99.1 F (37.3 C)      Temp Source 04/20/19 0634 Oral     SpO2 04/20/19 0634 98 %     Weight 04/20/19 0632 176 lb 5.9 oz (80 kg)     Height 04/20/19 0632 5\' 6"  (1.676 m)     Head Circumference --      Peak Flow --      Pain Score 04/20/19 0629 0     Pain Loc --      Pain Edu? --      Excl. in [place]? --     Constitutional: Alert and oriented. Well appearing and in no distress. Eyes: Conjunctivae are normal. Normal extraocular movements. Cardiovascular: Normal rate, regular rhythm. No murmurs, rubs, or gallops. Respiratory: Normal respiratory effort without tachypnea nor retractions. Breath sounds are clear and equal bilaterally. No wheezes/rales/rhonchi. Gastrointestinal: Soft and nontender. Normal bowel sounds Musculoskeletal: Nontender with normal range of motion in extremities. No lower extremity tenderness nor edema. Neurologic:  Normal speech and language. No gross focal neurologic deficits are appreciated.  Skin:  Skin is warm, dry and intact. No rash noted. Psychiatric: Mood and affect are normal. Speech and behavior are normal.  ____________________________________________  EKG: Interpreted by me.  Sinus rhythm with rate of 85 bpm, LVH, left axis deviation, normal QT  ____________________________________________  ED COURSE:  As part of my medical decision making, I reviewed the following data within the [place] History obtained from family if available, nursing notes, old chart and ekg, as well as notes from prior ED visits. Patient presented for palpitations, we will assess with labs and imaging as indicated at this time.  Procedures  TROYLENE FRIGON was evaluated in Emergency Department on 04/20/2019 for the symptoms described in the history of present illness. She was evaluated in the context of the global COVID-19 pandemic, which necessitated consideration that the patient might be at risk for infection with the SARS-CoV-2 virus that causes COVID-19. Institutional protocols  and algorithms that pertain to the evaluation of patients at risk for COVID-19 are in a state of rapid change based on information released by regulatory bodies including the CDC and federal and state organizations. These policies and algorithms were followed during the patient's care in the ED.  ____________________________________________   LABS (pertinent positives/negatives)  Labs Reviewed  BASIC METABOLIC PANEL - Abnormal; Notable for the following components:      Result Value   Glucose, Bld 112 (*)    All other components within normal limits  CBC - Abnormal; Notable for the following components:   RBC 5.31 (*)    Hemoglobin 15.9 (*)    HCT 46.5 (*)    All other components within normal limits  TROPONIN I (HIGH SENSITIVITY)    RADIOLOGY  Chest x-ray IMPRESSION:  Negative chest.  ____________________________________________   DIFFERENTIAL DIAGNOSIS   Arrhythmia, MI, unstable angina, electrolyte abnormality  FINAL ASSESSMENT AND PLAN  Palpitations   Plan: The patient had presented for palpitations. Patient's labs are reassuring. Patient's imaging did not reveal any acute process.  Patient is under significant stress which I think plays a part in this.  Her observing her on the monitor she occasionally has a premature beat but otherwise has a normal rhythm.   Laurence Aly, MD    Note: This note was generated in part or whole with voice recognition software. Voice recognition is usually quite accurate but there are transcription errors that can and very often do occur. I apologize for any typographical errors that were not detected and corrected.     Earleen Newport, MD 04/20/19 [phone]

## 2019-04-20 NOTE — ED Notes (Signed)
Pt states she is feeling better and can walk out on her own.

## 2019-05-02 ENCOUNTER — Ambulatory Visit: Payer: BC Managed Care – PPO | Admitting: Podiatry

## 2019-05-04 ENCOUNTER — Ambulatory Visit: Payer: BC Managed Care – PPO | Admitting: Orthotics

## 2019-05-04 ENCOUNTER — Other Ambulatory Visit: Payer: Self-pay

## 2019-05-04 ENCOUNTER — Ambulatory Visit: Payer: BC Managed Care – PPO | Admitting: Podiatry

## 2019-05-04 DIAGNOSIS — B351 Tinea unguium: Secondary | ICD-10-CM

## 2019-05-04 DIAGNOSIS — M722 Plantar fascial fibromatosis: Secondary | ICD-10-CM | POA: Diagnosis not present

## 2019-05-04 DIAGNOSIS — M79676 Pain in unspecified toe(s): Secondary | ICD-10-CM

## 2019-05-04 DIAGNOSIS — Q828 Other specified congenital malformations of skin: Secondary | ICD-10-CM | POA: Diagnosis not present

## 2019-05-04 NOTE — Progress Notes (Signed)

## 2019-05-04 NOTE — Progress Notes (Signed)
She presents today for follow-up of her plantar fasciitis states that she is doing quite well.  States that she is walking without pain.  She does have some poor keratomas that she would like to have removed lateral aspect of the right foot.  She states they have been bothersome for "quite some time and she is also here today to have her orthotics casted.  Objective: Vital signs are stable she is alert and oriented x3 pulses are palpable no open lesions or wounds has mild tenderness on palpation medial calcaneal tubercles bilateral.  Porokeratotic lesions multiple plantar lateral aspect of the right foot.  Assessment: Pain in limb secondary to porokeratosis plantar aspect of the right foot resolving plantar fasciitis bilateral.  Plan: Casted for orthotics today.  Debrided all reactive hyperkeratotic tissue plantarly.  Follow-up with her in 1 month to pick up orthotics.

## 2019-05-20 ENCOUNTER — Other Ambulatory Visit: Payer: Self-pay

## 2019-05-20 ENCOUNTER — Ambulatory Visit: Payer: BC Managed Care – PPO | Admitting: Orthotics

## 2019-05-20 DIAGNOSIS — M722 Plantar fascial fibromatosis: Secondary | ICD-10-CM

## 2019-07-11 ENCOUNTER — Ambulatory Visit: Payer: BC Managed Care – PPO | Admitting: Podiatry

## 2019-07-11 DIAGNOSIS — F411 Generalized anxiety disorder: Secondary | ICD-10-CM | POA: Insufficient documentation

## 2019-08-03 ENCOUNTER — Encounter: Payer: Self-pay | Admitting: Podiatry

## 2019-08-03 ENCOUNTER — Ambulatory Visit (INDEPENDENT_AMBULATORY_CARE_PROVIDER_SITE_OTHER): Payer: BC Managed Care – PPO | Admitting: Podiatry

## 2019-08-03 ENCOUNTER — Other Ambulatory Visit: Payer: Self-pay

## 2019-08-03 DIAGNOSIS — B351 Tinea unguium: Secondary | ICD-10-CM | POA: Diagnosis not present

## 2019-08-03 DIAGNOSIS — M79676 Pain in unspecified toe(s): Secondary | ICD-10-CM | POA: Diagnosis not present

## 2019-08-03 DIAGNOSIS — Q828 Other specified congenital malformations of skin: Secondary | ICD-10-CM

## 2019-08-03 DIAGNOSIS — M722 Plantar fascial fibromatosis: Secondary | ICD-10-CM

## 2019-08-03 NOTE — Progress Notes (Signed)
She presents today for follow-up of her bilateral heels states that they still hurt in the orthotics seem to be not helpful as I thought there will not be.    Objective: Vital signs are stable she is alert and oriented x3.  Pulses are palpable the heel pain has decreased considerably upon palpation not nearly as sore as they were last visit.  Patient agrees to this.  Toenails are long thick yellow dystrophic-like mycotic multiple reactive hyperkeratotic lesions plantar aspect of the foot.  Assessment: Slowly resolving plantar fasciitis.  Pain in limb secondary to onychomycosis and porokeratosis.  Plan: Instructed her to wear her orthotics and every shoe that she wears not just leaving them in her work shoes.  I also instructed her to purchase wider shoes.  Also debrided her painful elongated toenails debridement of all reactive hyperkeratosis as well.  Follow-up as needed.

## 2020-01-16 ENCOUNTER — Other Ambulatory Visit: Payer: Self-pay | Admitting: Internal Medicine

## 2020-01-16 DIAGNOSIS — Z1231 Encounter for screening mammogram for malignant neoplasm of breast: Secondary | ICD-10-CM

## 2020-02-22 ENCOUNTER — Ambulatory Visit
Admission: RE | Admit: 2020-02-22 | Discharge: 2020-02-22 | Disposition: A | Discharge status: Home / Self Care | Payer: BC Managed Care – PPO | Source: Ambulatory Visit | Attending: Internal Medicine | Admitting: Internal Medicine

## 2020-02-22 ENCOUNTER — Other Ambulatory Visit: Payer: Self-pay

## 2020-02-22 DIAGNOSIS — Z1231 Encounter for screening mammogram for malignant neoplasm of breast: Secondary | ICD-10-CM | POA: Diagnosis not present

## 2020-02-27 NOTE — Progress Notes (Signed)
Patient picked up f/o; reported no problems.

## 2020-04-07 IMAGING — CR DG CHEST 2V
2 series · 2 of 2 positions shown · non-contrast
Comparison: None.

CLINICAL DATA: Flutter sensation in the chest since last night.

EXAM:
CHEST - 2 VIEW

[chest pa]
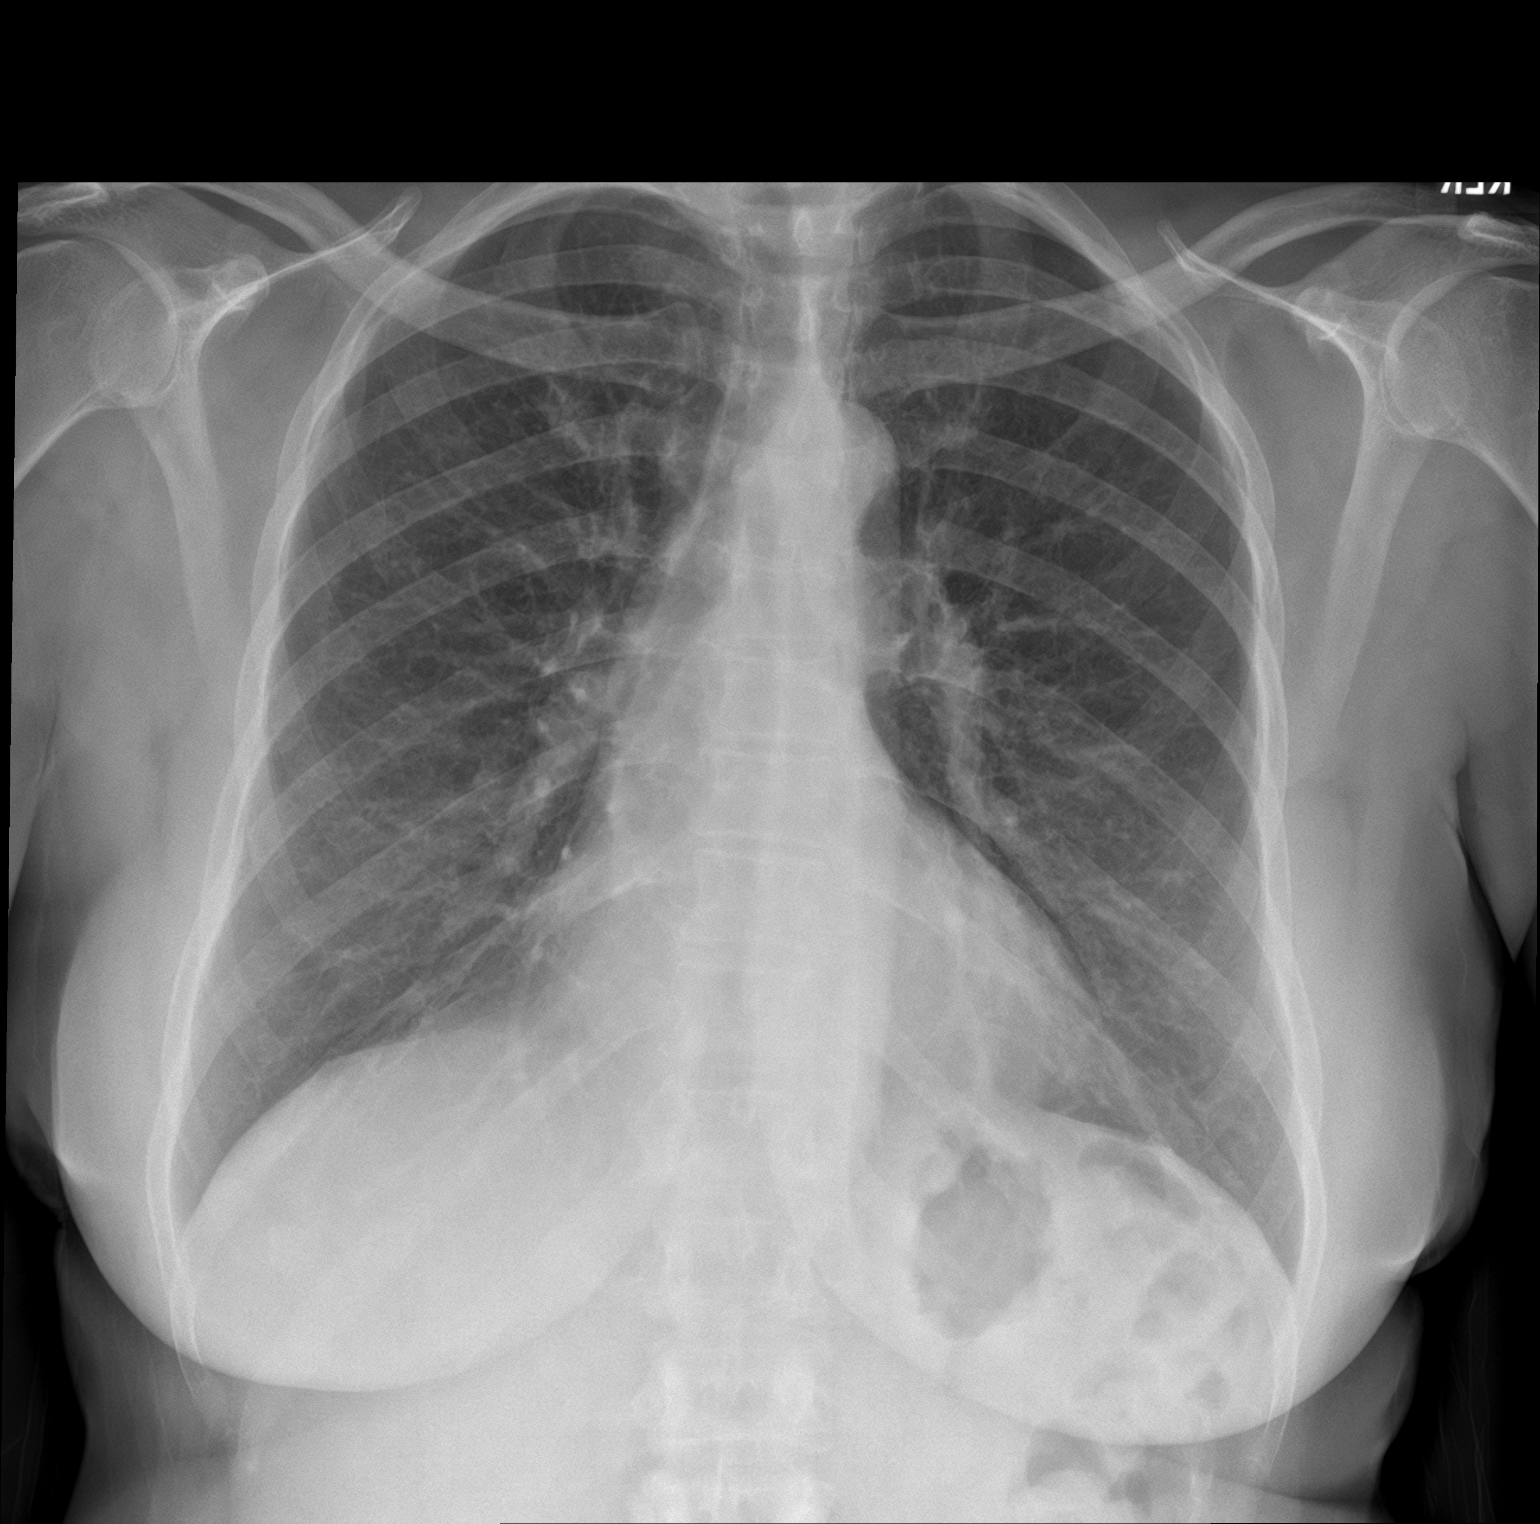

[chest lat]
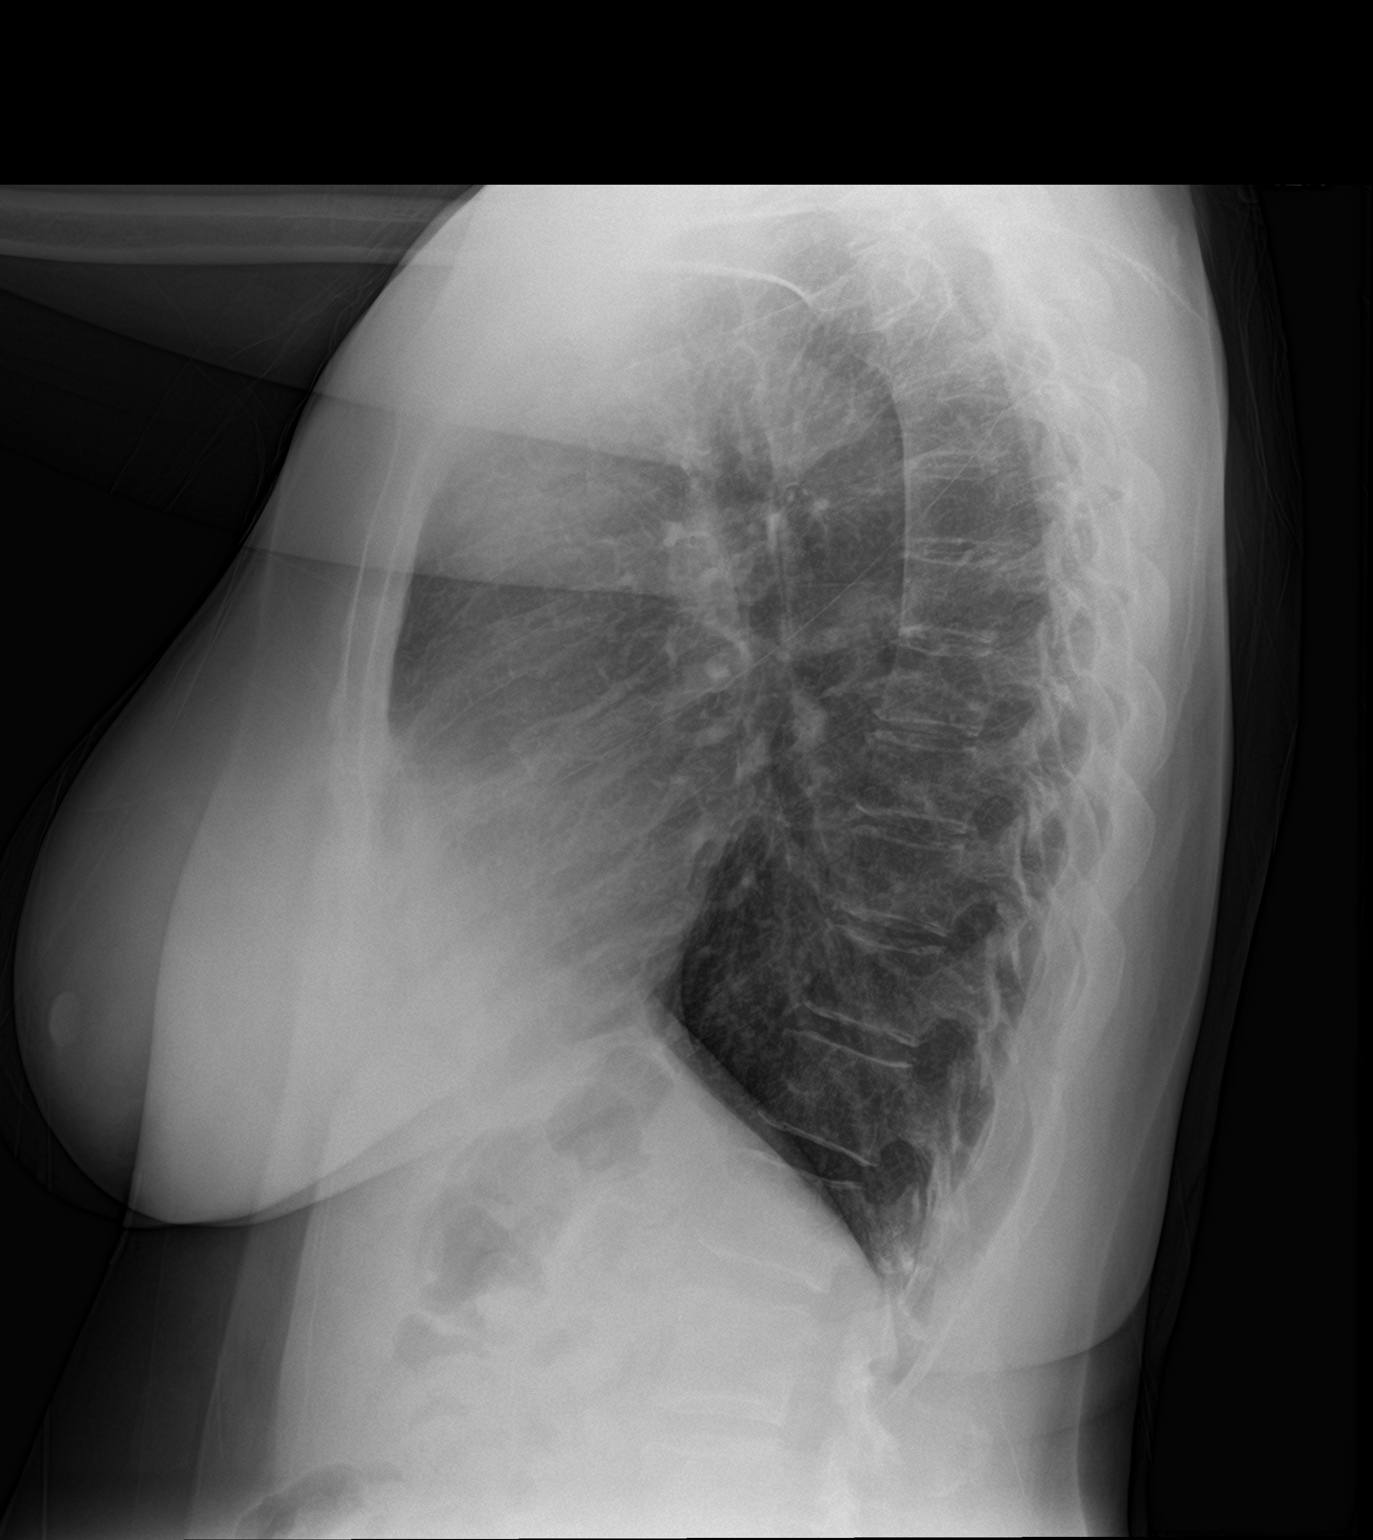

[2 of 2 positions shown; findings below may reference images not displayed]

FINDINGS: Lungs clear. Heart size normal. No pneumothorax or pleural fluid. No
acute or focal bony abnormality.
IMPRESSION: Negative chest.

## 2020-04-30 ENCOUNTER — Ambulatory Visit: Payer: BC Managed Care – PPO | Admitting: Podiatry

## 2020-05-16 ENCOUNTER — Ambulatory Visit: Payer: BC Managed Care – PPO | Admitting: Podiatry

## 2020-06-11 ENCOUNTER — Other Ambulatory Visit: Payer: Self-pay

## 2020-06-11 ENCOUNTER — Encounter: Payer: Self-pay | Admitting: Podiatry

## 2020-06-11 ENCOUNTER — Ambulatory Visit (INDEPENDENT_AMBULATORY_CARE_PROVIDER_SITE_OTHER): Payer: BC Managed Care – PPO | Admitting: Podiatry

## 2020-06-11 DIAGNOSIS — M79676 Pain in unspecified toe(s): Secondary | ICD-10-CM | POA: Diagnosis not present

## 2020-06-11 DIAGNOSIS — Q828 Other specified congenital malformations of skin: Secondary | ICD-10-CM

## 2020-06-11 DIAGNOSIS — B351 Tinea unguium: Secondary | ICD-10-CM

## 2020-06-11 NOTE — Progress Notes (Signed)
She presents today chief complaint of painfully elongated toenails and calluses bilaterally.  Objective: Vital signs are stable she is alert oriented x3.  There is no erythema edema cellulitis drainage odor toenails are long thick yellow dystrophic clinically mycotic painful palpation.  Assessment: Pain in limb secondary to onychomycosis.  Plan: Debridement of toenails 1 through 5 bilateral.

## 2020-09-12 ENCOUNTER — Other Ambulatory Visit: Payer: Self-pay

## 2020-09-12 ENCOUNTER — Ambulatory Visit: Payer: BC Managed Care – PPO | Admitting: Podiatry

## 2020-09-12 ENCOUNTER — Encounter: Payer: Self-pay | Admitting: Podiatry

## 2020-09-12 DIAGNOSIS — M79676 Pain in unspecified toe(s): Secondary | ICD-10-CM

## 2020-09-12 DIAGNOSIS — B351 Tinea unguium: Secondary | ICD-10-CM

## 2020-09-12 NOTE — Progress Notes (Signed)
She presents today chief complaint of painfully elongated toenails.  Objective: Toenails are long thick yellow dystrophic onychomycotic pulses remain palpable no open lesions or wounds are noted.  Assessment: Pain in limb secondary onychomycosis.  Plan: Debridement of toenails 1 through 5 bilateral.

## 2021-01-14 ENCOUNTER — Other Ambulatory Visit: Payer: Self-pay | Admitting: Internal Medicine

## 2021-01-14 DIAGNOSIS — Z1231 Encounter for screening mammogram for malignant neoplasm of breast: Secondary | ICD-10-CM

## 2021-02-25 ENCOUNTER — Ambulatory Visit
Admission: RE | Admit: 2021-02-25 | Discharge: 2021-02-25 | Disposition: A | Discharge status: Home / Self Care | Payer: BC Managed Care – PPO | Source: Ambulatory Visit | Attending: Internal Medicine | Admitting: Internal Medicine

## 2021-02-25 ENCOUNTER — Other Ambulatory Visit: Payer: Self-pay

## 2021-02-25 DIAGNOSIS — Z1231 Encounter for screening mammogram for malignant neoplasm of breast: Secondary | ICD-10-CM | POA: Insufficient documentation

## 2021-05-01 ENCOUNTER — Encounter: Payer: Self-pay | Admitting: Podiatry

## 2021-05-01 ENCOUNTER — Ambulatory Visit: Payer: BC Managed Care – PPO | Admitting: Podiatry

## 2021-05-01 ENCOUNTER — Other Ambulatory Visit: Payer: Self-pay

## 2021-05-01 DIAGNOSIS — M79676 Pain in unspecified toe(s): Secondary | ICD-10-CM

## 2021-05-01 DIAGNOSIS — D2372 Other benign neoplasm of skin of left lower limb, including hip: Secondary | ICD-10-CM | POA: Diagnosis not present

## 2021-05-01 DIAGNOSIS — B351 Tinea unguium: Secondary | ICD-10-CM | POA: Diagnosis not present

## 2021-05-01 DIAGNOSIS — D2371 Other benign neoplasm of skin of right lower limb, including hip: Secondary | ICD-10-CM

## 2021-05-01 NOTE — Progress Notes (Signed)
She presents today for follow-up of painful elongated toenails.  Objective: Nails are long thick yellow dystrophic-like mycotic painful palpation.  Assessment: Pain limb secondary onychomycosis.  Plan: Debridement of toenails 1 through 5 bilateral.

## 2022-01-15 ENCOUNTER — Encounter: Payer: Self-pay | Admitting: Podiatry

## 2022-01-15 ENCOUNTER — Ambulatory Visit: Payer: BC Managed Care – PPO | Admitting: Podiatry

## 2022-01-15 DIAGNOSIS — B351 Tinea unguium: Secondary | ICD-10-CM

## 2022-01-15 DIAGNOSIS — D2372 Other benign neoplasm of skin of left lower limb, including hip: Secondary | ICD-10-CM

## 2022-01-15 DIAGNOSIS — M79676 Pain in unspecified toe(s): Secondary | ICD-10-CM | POA: Diagnosis not present

## 2022-01-15 DIAGNOSIS — D2371 Other benign neoplasm of skin of right lower limb, including hip: Secondary | ICD-10-CM

## 2022-01-15 NOTE — Progress Notes (Signed)
She presents today chief complaint of painfully elongated toenails.  Objective: Vital signs are stable alert oriented x3.  There is no erythema edema cellulitis drainage or odor.  Toenails are long thick yellow dystrophic with mycotic painful palpation as well as debridement.  Assessment: Pain in limb secondary to onychomycosis.  Plan: Debridement of toenails 1 through 5 bilateral.

## 2022-01-20 ENCOUNTER — Other Ambulatory Visit: Payer: Self-pay | Admitting: Internal Medicine

## 2022-01-20 DIAGNOSIS — Z1231 Encounter for screening mammogram for malignant neoplasm of breast: Secondary | ICD-10-CM

## 2022-02-26 ENCOUNTER — Ambulatory Visit
Admission: RE | Admit: 2022-02-26 | Discharge: 2022-02-26 | Disposition: A | Discharge status: Home / Self Care | Payer: BC Managed Care – PPO | Source: Ambulatory Visit | Attending: Internal Medicine | Admitting: Internal Medicine

## 2022-02-26 DIAGNOSIS — Z1231 Encounter for screening mammogram for malignant neoplasm of breast: Secondary | ICD-10-CM | POA: Diagnosis present

## 2022-08-06 ENCOUNTER — Ambulatory Visit: Payer: BC Managed Care – PPO | Admitting: Podiatry

## 2022-08-20 ENCOUNTER — Ambulatory Visit: Payer: BC Managed Care – PPO | Admitting: Podiatry

## 2022-08-20 DIAGNOSIS — B351 Tinea unguium: Secondary | ICD-10-CM | POA: Diagnosis not present

## 2022-08-20 DIAGNOSIS — M79676 Pain in unspecified toe(s): Secondary | ICD-10-CM | POA: Diagnosis not present

## 2022-08-20 NOTE — Progress Notes (Signed)
She presents today chief complaint of painful elongated toenails.  Objective: Tenderness along thick yellow dystrophic and mycotic painful palpation.  Assessment: Pain in limb secondary onychomycosis.  Plan: Debridement of toenails 1 through 5 bilateral.

## 2023-01-19 ENCOUNTER — Other Ambulatory Visit: Payer: Self-pay | Admitting: Internal Medicine

## 2023-01-19 DIAGNOSIS — Z1231 Encounter for screening mammogram for malignant neoplasm of breast: Secondary | ICD-10-CM

## 2023-03-02 ENCOUNTER — Ambulatory Visit
Admission: RE | Admit: 2023-03-02 | Discharge: 2023-03-02 | Disposition: A | Discharge status: Home / Self Care | Payer: Medicare Other | Source: Ambulatory Visit | Attending: Internal Medicine | Admitting: Internal Medicine

## 2023-03-02 DIAGNOSIS — Z1231 Encounter for screening mammogram for malignant neoplasm of breast: Secondary | ICD-10-CM | POA: Insufficient documentation

## 2023-10-12 ENCOUNTER — Ambulatory Visit: Admitting: Podiatry

## 2023-11-09 ENCOUNTER — Ambulatory Visit: Admitting: Podiatry

## 2023-11-21 ENCOUNTER — Encounter: Payer: Self-pay | Admitting: Emergency Medicine

## 2023-11-21 ENCOUNTER — Ambulatory Visit
Admission: EM | Admit: 2023-11-21 | Discharge: 2023-11-21 | Disposition: A | Discharge status: Home / Self Care | Attending: Emergency Medicine | Admitting: Emergency Medicine

## 2023-11-21 DIAGNOSIS — R42 Dizziness and giddiness: Secondary | ICD-10-CM | POA: Diagnosis not present

## 2023-11-21 DIAGNOSIS — W57XXXA Bitten or stung by nonvenomous insect and other nonvenomous arthropods, initial encounter: Secondary | ICD-10-CM | POA: Diagnosis not present

## 2023-11-21 DIAGNOSIS — S70361A Insect bite (nonvenomous), right thigh, initial encounter: Secondary | ICD-10-CM

## 2023-11-21 NOTE — ED Triage Notes (Signed)
 Onset Thursday morning.  States she thinks she took her medication too close together.  Episode lasted about 15 monutes.  Has not has a recurrence of her dizziness.  Denies CP, SOB.  Patient does have LE edema for which she takes a diuretic.  Patient has been under some additiona stress lately.  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   Tick bite 4/25. Right upper leg.  Patient states she removed tick but bite site continues to itch

## 2023-11-21 NOTE — ED Provider Notes (Signed)
 [facility]    CSN: [id number] Arrival date & time: 11/21/23  9185      History   Chief Complaint Chief Complaint  Patient presents with   Dizziness   Tick Removal    HPI Victoria Boyd is a 68 y.o. female.   HPI  68 year old female with past medical history significant for hypertension, generalized anxiety disorder, chronic seasonal allergic rhinitis, hyperlipidemia, and vitamin D deficiency presents for evaluation of 2 complaints.  The first complaint is of an episode of dizziness that lasted approximately 15 minutes 2 days ago.  It resolved on its own and has not returned.  It was not associated with any chest pain, shortness breath, or syncope.  She believes she may have taken her medicine too close together.  Her other complaint is that she has a tick bite on the posterior lateral aspect of her right thigh that occurred in April and she is complaining that the area continues to itch.  She has been applying alcohol.  She reports that her daughter mentioned that the area was red.  No fever.  Past Medical History:  Diagnosis Date   Hypertension     Patient Active Problem List   Diagnosis Date Noted   GAD (generalized anxiety disorder) 07/11/2019   Essential hypertension 01/11/2019   History of adenomatous polyp of colon 09/30/2018   Chronic seasonal allergic rhinitis 12/31/2015   HLD (hyperlipidemia) 07/04/2015   Vitamin D deficiency 07/05/2014   Compulsive tobacco user syndrome 07/05/2014   Low vitamin D level 07/05/2014   Tobacco use disorder 07/05/2014    History reviewed. No pertinent surgical history.  OB History   No obstetric history on file.      Home Medications    Prior to Admission medications   Medication Sig Start Date End Date Taking? Authorizing Provider  amLODipine (NORVASC) 5 MG tablet Take 5 mg by mouth daily. 07/05/19  Yes [provider]  aspirin EC 81 MG tablet Take by mouth.   Yes [provider]   Cholecalciferol (VITAMIN D3) 2000 UNITS capsule Take by mouth.   Yes [provider]  fluticasone (FLONASE) 50 MCG/ACT nasal spray Place 1 spray into the nose daily.  09/02/13  Yes [provider]  furosemide (LASIX) 20 MG tablet Take 20 mg by mouth every other day. 09/13/18  Yes [provider]  metoprolol succinate (TOPROL-XL) 25 MG 24 hr tablet Take 25 mg by mouth daily. 07/11/19  Yes [provider]  montelukast (SINGULAIR) 10 MG tablet Take 10 mg by mouth daily.  03/08/14  Yes [provider]  Multiple Vitamin (MULTI-VITAMINS) TABS Take by mouth.   Yes [provider]  simvastatin (ZOCOR) 40 MG tablet Take 40 mg by mouth daily.  09/02/13  Yes [provider]  LORazepam  (ATIVAN ) 0.5 MG tablet Take 1 tablet (0.5 mg total) by mouth 2 (two) times daily as needed for up to 20 doses for anxiety. Patient not taking: Reported on 11/21/2023 04/20/19   Trudy Dorn BRAVO, MD    Family History Family History  Problem Relation Age of Onset   Breast cancer Neg Hx     Social History Social History   Tobacco Use   Smoking status: Every Day   Smokeless tobacco: Never  Substance Use Topics   Alcohol use: No     Allergies   Penicillins   Review of Systems Review of Systems  Constitutional:  Negative for fever.  Respiratory:  Negative for shortness of breath.  Cardiovascular:  Negative for chest pain and palpitations.  Skin:  Positive for color change and wound.  Neurological:  Positive for dizziness. Negative for syncope.     Physical Exam Triage Vital Signs ED Triage Vitals  Encounter Vitals Group     BP      Girls Systolic BP Percentile      Girls Diastolic BP Percentile      Boys Systolic BP Percentile      Boys Diastolic BP Percentile      Pulse      Resp      Temp      Temp src      SpO2      Weight      Height      Head Circumference      Peak Flow      Pain Score      Pain Loc      Pain Education       Exclude from Growth Chart    No data found.  Updated Vital Signs BP 115/86 (BP Location: Left Arm)   Pulse 96   Temp 97.7 F (36.5 C) (Oral)   SpO2 97%   Visual Acuity Right Eye Distance:   Left Eye Distance:   Bilateral Distance:    Right Eye Near:   Left Eye Near:    Bilateral Near:     Physical Exam Vitals and nursing note reviewed.  Constitutional:      Appearance: Normal appearance. She is not ill-appearing.  HENT:     Head: Normocephalic and atraumatic.     Right Ear: Tympanic membrane, ear canal and external ear normal. There is no impacted cerumen.     Left Ear: Tympanic membrane, ear canal and external ear normal. There is no impacted cerumen.     Nose: Nose normal. No congestion or rhinorrhea.     Mouth/Throat:     Mouth: Mucous membranes are moist.     Pharynx: Oropharynx is clear. No oropharyngeal exudate or posterior oropharyngeal erythema.  Cardiovascular:     Rate and Rhythm: Normal rate and regular rhythm.     Pulses: Normal pulses.     Heart sounds: Normal heart sounds. No murmur heard.    No friction rub. No gallop.  Pulmonary:     Effort: Pulmonary effort is normal.     Breath sounds: Normal breath sounds. No wheezing, rhonchi or rales.  Musculoskeletal:     Cervical back: Normal range of motion and neck supple. No tenderness.  Lymphadenopathy:     Cervical: No cervical adenopathy.  Skin:    General: Skin is warm and dry.     Capillary Refill: Capillary refill takes less than 2 seconds.     Findings: Lesion present. No erythema.  Neurological:     General: No focal deficit present.     Mental Status: She is alert and oriented to person, place, and time.      UC Treatments / Results  Labs (all labs ordered are listed, but only abnormal results are displayed) Labs Reviewed - No data to display  EKG Normal sinus rhythm with a ventricular rate of 91 bpm PR interval 168 ms QRS duration 92 ms QT/QTc 390/479 ms Left axis deviation with  prolonged QT.  Possible left atrial enlargement.  Left ventricular hypertrophy.  No ST or T wave abnormalities noted.  No appreciable changes noted when compared to EKG dated 04/20/2019.  Radiology No results found.  Procedures Procedures (including critical care time)  Medications Ordered in UC Medications - No data to display  Initial Impression / Assessment and Plan / UC Course  I have reviewed the triage vital signs and the nursing notes.  Pertinent labs & imaging results that were available during my care of the patient were reviewed by me and considered in my medical decision making (see chart for details).   Patient is a pleasant, nontoxic-appearing 68 year old female presenting for evaluation of a single episode of dizziness that occurred 2 days ago.  She is not experiencing any symptoms now.  She thinks that she took her medicine too close together.  EKG shows normal sinus rhythm with left axis deviation and prolonged QT.  There is no appreciable change when compared to EKG from 04/20/2019.  Cardiopulmonary exam reveals S1-S2 heart sounds with regular rate and rhythm lung sounds are clear to auscultation all fields.  She denies any URI symptoms and her upper respiratory tract is free of any abnormalities.  Her second complaint is that she received a tick bite to the posterior lateral aspect of her right thigh on 425.  She removed the tick.  She reports that the area continues to itch and her daughter mention the other day that it appeared red.  She has not had a fever or drainage.  Visual inspection reveals a small area of hyperpigmentation where the tick bite was as well as a slight raising of the skin but no erythema, induration, or fluctuance to suggest cellulitis.  No erythema migrans rash to suggest Lyme.  I advised her that applying alcohol may be contributing to the itching as it dries out the skin.  She may use over-the-counter Cortizone-10 or topical Benadryl cream to help with  itching.  She should follow the package instructions for dosing.   Final Clinical Impressions(s) / UC Diagnoses   Final diagnoses:  Dizziness  Tick bite of right thigh, initial encounter     Discharge Instructions      Your dizziness has resolved and the cause is unclear.  It may very well have been associated with taking her medicines in close proximity.  I would make sure that they are spread out and you do not take your diuretic, blood pressure medicine, and Ativan  too close together as they can synergize and cause dizziness.  With regards to the tick bite, it does not appear to be infected and I do not feel that you need antibiotics at this time.  I would stop applying alcohol as the alcohol may be drying out your skin and contributing to the itching.  If you continue to have itching you may apply topical Benadryl cream or topical cortisone 10, follow the package instructions for dosing.  If you have a return of your dizziness, especially if it is associated with changes in vision, headache, chest pain, shortness of breath, sweating, or nausea I would call 911 and go to the emergency department.     ED Prescriptions   None    PDMP not reviewed this encounter.   Bernardino Ditch, NP 11/21/23 0900

## 2023-11-21 NOTE — Discharge Instructions (Addendum)
 Your dizziness has resolved and the cause is unclear.  It may very well have been associated with taking her medicines in close proximity.  I would make sure that they are spread out and you do not take your diuretic, blood pressure medicine, and Ativan  too close together as they can synergize and cause dizziness.  With regards to the tick bite, it does not appear to be infected and I do not feel that you need antibiotics at this time.  I would stop applying alcohol as the alcohol may be drying out your skin and contributing to the itching.  If you continue to have itching you may apply topical Benadryl cream or topical cortisone 10, follow the package instructions for dosing.  If you have a return of your dizziness, especially if it is associated with changes in vision, headache, chest pain, shortness of breath, sweating, or nausea I would call 911 and go to the emergency department.

## 2023-11-23 ENCOUNTER — Ambulatory Visit: Admitting: Podiatry

## 2023-11-23 DIAGNOSIS — B351 Tinea unguium: Secondary | ICD-10-CM

## 2023-11-23 DIAGNOSIS — M79676 Pain in unspecified toe(s): Secondary | ICD-10-CM

## 2023-11-23 NOTE — Progress Notes (Signed)
 She presents today chief complaint of painful elongated toenails.  Objective: Pulses are palpable no open lesions or wounds toenails are long thick yellow dystrophic clinical mycotic and painful on palpation.  Assessment: Pain in limb secondary to onychomycosis.  Plan: Debridement of toenails 1 through 5 bilateral covered service secondary to pain.

## 2024-01-12 ENCOUNTER — Ambulatory Visit
Admission: RE | Admit: 2024-01-12 | Discharge: 2024-01-12 | Disposition: A | Discharge status: Home / Self Care | Attending: Internal Medicine | Admitting: Internal Medicine

## 2024-01-12 ENCOUNTER — Encounter: Payer: Self-pay | Admitting: Internal Medicine

## 2024-01-12 ENCOUNTER — Other Ambulatory Visit: Payer: Self-pay

## 2024-01-12 ENCOUNTER — Ambulatory Visit: Admitting: Anesthesiology

## 2024-01-12 ENCOUNTER — Encounter
Admission: RE | Disposition: A | Discharge status: Home / Self Care | Payer: Self-pay | Source: Home / Self Care | Attending: Internal Medicine

## 2024-01-12 DIAGNOSIS — D120 Benign neoplasm of cecum: Secondary | ICD-10-CM | POA: Diagnosis not present

## 2024-01-12 DIAGNOSIS — K5730 Diverticulosis of large intestine without perforation or abscess without bleeding: Secondary | ICD-10-CM | POA: Insufficient documentation

## 2024-01-12 DIAGNOSIS — Z1211 Encounter for screening for malignant neoplasm of colon: Secondary | ICD-10-CM | POA: Diagnosis present

## 2024-01-12 DIAGNOSIS — I10 Essential (primary) hypertension: Secondary | ICD-10-CM | POA: Diagnosis not present

## 2024-01-12 DIAGNOSIS — F17200 Nicotine dependence, unspecified, uncomplicated: Secondary | ICD-10-CM | POA: Diagnosis not present

## 2024-01-12 DIAGNOSIS — K640 First degree hemorrhoids: Secondary | ICD-10-CM | POA: Diagnosis not present

## 2024-01-12 HISTORY — PX: POLYPECTOMY: SHX149

## 2024-01-12 HISTORY — PX: COLONOSCOPY: SHX5424

## 2024-01-12 SURGERY — COLONOSCOPY
Anesthesia: General

## 2024-01-12 MED ORDER — DEXMEDETOMIDINE HCL IN NACL 80 MCG/20ML IV SOLN
INTRAVENOUS | Status: DC | PRN
  Administered 2024-01-12: 12 ug via INTRAVENOUS
  Administered 2024-01-12: 8 ug via INTRAVENOUS

## 2024-01-12 MED ORDER — PROPOFOL 500 MG/50ML IV EMUL
INTRAVENOUS | Status: DC | PRN
  Administered 2024-01-12: 75 ug/kg/min via INTRAVENOUS

## 2024-01-12 MED ORDER — LIDOCAINE HCL (PF) 2 % IJ SOLN
INTRAMUSCULAR | Status: AC
  Filled 2024-01-12: qty 5

## 2024-01-12 MED ORDER — SODIUM CHLORIDE 0.9 % IV SOLN: INTRAVENOUS | Status: DC

## 2024-01-12 MED ORDER — PROPOFOL 10 MG/ML IV BOLUS
INTRAVENOUS | Status: DC | PRN
Start: 2024-01-12 — End: 2024-01-12
  Administered 2024-01-12 (×3): 50 mg via INTRAVENOUS

## 2024-01-12 MED ORDER — LIDOCAINE HCL (CARDIAC) PF 100 MG/5ML IV SOSY
PREFILLED_SYRINGE | INTRAVENOUS | Status: DC | PRN
  Administered 2024-01-12: 60 mg via INTRAVENOUS

## 2024-01-12 NOTE — Anesthesia Postprocedure Evaluation (Signed)
 Anesthesia Post Note  Patient: Victoria Boyd  Procedure(s) Performed: COLONOSCOPY  Patient location during evaluation: PACU Anesthesia Type: General Level of consciousness: awake and alert Pain management: pain level controlled Vital Signs Assessment: post-procedure vital signs reviewed and stable Respiratory status: spontaneous breathing, nonlabored ventilation, respiratory function stable and patient connected to nasal cannula oxygen Cardiovascular status: blood pressure returned to baseline and stable Postop Assessment: no apparent nausea or vomiting Anesthetic complications: no   No notable events documented.   Last Vitals:  Vitals:   01/12/24 0949 01/12/24 0952  BP: 97/81 114/76  Pulse: 87 87  Resp: (!) 21 20  Temp:    SpO2: 97% 100%    Last Pain:  Vitals:   01/12/24 0949  TempSrc:   PainSc: 0-No pain                 Lynwood KANDICE Clause

## 2024-01-12 NOTE — Op Note (Signed)
 [facility] Patient Name: Victoria Boyd Procedure Date: 01/12/2024 9:07 AM MRN: [id number] Account #: [id number] Date of Birth: [birth year] Admit Type: Outpatient Age: 68 Room: [facility] ENDO ROOM 3 Gender: Female Note Status: Finalized Instrument Name: Colon Scope [id number] Procedure:             Colonoscopy Indications:           High risk colon cancer surveillance: Personal history                         of non-advanced adenoma Providers:             Seferina Brokaw K. [name] MD, MD Referring MD:          Tamra Leventhal, MD (Referring MD) Medicines:             Propofol per Anesthesia Complications:         No immediate complications. Estimated blood loss:                         Minimal. Procedure:             Pre-Anesthesia Assessment:                        - The risks and benefits of the procedure and the                         sedation options and risks were discussed with the                         patient. All questions were answered and informed                         consent was obtained.                        - Patient identification and proposed procedure were                         verified prior to the procedure by the nurse. The                         procedure was verified in the procedure room.                        - ASA Grade Assessment: III - A patient with severe                         systemic disease.                        - After reviewing the risks and benefits, the patient                         was deemed in satisfactory condition to undergo the                         procedure.                        After obtaining informed consent, the  colonoscope was                         passed under direct vision. Throughout the procedure,                         the patient's blood pressure, pulse, and oxygen                         saturations were monitored continuously. The                         Colonoscope was introduced  through the anus and                         advanced to the the cecum, identified by appendiceal                         orifice and ileocecal valve. The colonoscopy was                         somewhat difficult due to significant looping.                         Successful completion of the procedure was aided by                         applying abdominal pressure. The patient tolerated the                         procedure well. The quality of the bowel preparation                         was good. The ileocecal valve, appendiceal orifice,                         and rectum were photographed. Findings:      The perianal and digital rectal examinations were normal. Pertinent       negatives include normal sphincter tone and no palpable rectal lesions.      Non-bleeding internal hemorrhoids were found during retroflexion. The       hemorrhoids were Grade I (internal hemorrhoids that do not prolapse).      Many small-mouthed diverticula were found in the sigmoid colon.      A 5 mm polyp was found in the cecum. The polyp was sessile. The polyp       was removed with a piecemeal technique using a cold biopsy forceps.       Resection and retrieval were complete. Estimated blood loss was minimal.      The exam was otherwise without abnormality. Impression:            - Non-bleeding internal hemorrhoids.                        - Diverticulosis in the sigmoid colon.                        - One 5 mm polyp in the cecum, removed piecemeal using  a cold biopsy forceps. Resected and retrieved.                        - The examination was otherwise normal. Recommendation:        - Patient has a contact number available for                         emergencies. The signs and symptoms of potential                         delayed complications were discussed with the patient.                         Return to normal activities tomorrow. Written                         discharge  instructions were provided to the patient.                        - Resume previous diet.                        - Continue present medications.                        - Repeat colonoscopy is recommended for surveillance.                         The colonoscopy date will be determined after                         pathology results from today's exam become available                         for review.                        - Return to GI office PRN.                        - The findings and recommendations were discussed with                         the patient. Procedure Code(s):     --- Professional ---                        [id number], Colonoscopy, flexible; with biopsy, single or                         multiple Diagnosis Code(s):     --- Professional ---                        K57.30, Diverticulosis of large intestine without                         perforation or abscess without bleeding                        D12.0, Benign neoplasm of cecum  K64.0, First degree hemorrhoids                        Z86.010, Personal history of colonic polyps CPT copyright 2022 [employer]. All rights reserved. The codes documented in this report are preliminary and upon coder review may  be revised to meet current compliance requirements. [name] MD, MD 01/12/2024 9:30:28 AM This report has been signed electronically. Number of Addenda: 0 Note Initiated On: 01/12/2024 9:07 AM Scope Withdrawal Time: 0 hours 4 minutes 46 seconds  Total Procedure Duration: 0 hours 9 minutes 20 seconds  Estimated Blood Loss:  Estimated blood loss: none. Estimated blood loss was                         minimal.      [facility]

## 2024-01-12 NOTE — Transfer of Care (Signed)
 Immediate Anesthesia Transfer of Care Note  Patient: Victoria Boyd  Procedure(s) Performed: COLONOSCOPY  Patient Location: PACU  Anesthesia Type:General  Level of Consciousness: sedated  Airway & Oxygen Therapy: Patient Spontanous Breathing  Post-op Assessment: Report given to RN and Post -op Vital signs reviewed and stable  Post vital signs: Reviewed and stable  Last Vitals:  Vitals Value Taken Time  BP 81/61 01/12/24 09:29  Temp    Pulse 90 01/12/24 09:30  Resp 33 01/12/24 09:30  SpO2 97 % 01/12/24 09:30  Vitals shown include unfiled device data.  Last Pain:  Vitals:   01/12/24 0845  TempSrc: Temporal  PainSc: 0-No pain         Complications: No notable events documented.

## 2024-01-12 NOTE — Interval H&P Note (Signed)
 History and Physical Interval Note:  01/12/2024 9:00 AM  Victoria Boyd  has presented today for surgery, with the diagnosis of History of adenomatous polyp of colon [Z86.0101].  The various methods of treatment have been discussed with the patient and family. After consideration of risks, benefits and other options for treatment, the patient has consented to  Procedure(s): COLONOSCOPY (N/A) as a surgical intervention.  The patient's history has been reviewed, patient examined, no change in status, stable for surgery.  I have reviewed the patient's chart and labs.  Questions were answered to the patient's satisfaction.     Judson, Tyquavious Gamel

## 2024-01-12 NOTE — H&P (Signed)
 Outpatient short stay form Pre-procedure 01/12/2024 8:59 AM Victoria Boyd K. [name], M.D.  Primary Physician: Tamra Leventhal, M.D.  Reason for visit:  Hx adenomatous colon polyps  History of present illness:                            Patient presents for colonoscopy for a personal hx of colon polyps. The patient denies abdominal pain, abnormal weight loss or rectal bleeding.      Current Facility-Administered Medications:    0.9 %  sodium chloride infusion, , Intravenous, Continuous, [place], Ladell POUR, MD, Last Rate: 20 mL/hr at 01/12/24 0851, New Bag at 01/12/24 0851  Medications Prior to Admission  Medication Sig Dispense Refill Last Dose/Taking   amLODipine (NORVASC) 5 MG tablet Take 5 mg by mouth daily.   01/12/2024 at  4:00 AM   aspirin EC 81 MG tablet Take by mouth.   Past Week   Cholecalciferol (VITAMIN D3) 2000 UNITS capsule Take by mouth.   Past Week   fluticasone (FLONASE) 50 MCG/ACT nasal spray Place 1 spray into the nose daily.    01/11/2024   metoprolol succinate (TOPROL-XL) 25 MG 24 hr tablet Take 25 mg by mouth daily.   01/12/2024 at  4:00 AM   montelukast (SINGULAIR) 10 MG tablet Take 10 mg by mouth daily.    01/11/2024   Multiple Vitamin (MULTI-VITAMINS) TABS Take by mouth.   Past Week   simvastatin (ZOCOR) 40 MG tablet Take 40 mg by mouth daily.    01/11/2024   furosemide (LASIX) 20 MG tablet Take 20 mg by mouth every other day.   01/08/2024   LORazepam  (ATIVAN ) 0.5 MG tablet Take 1 tablet (0.5 mg total) by mouth 2 (two) times daily as needed for up to 20 doses for anxiety. (Patient not taking: Reported on 11/21/2023) 20 tablet 0      Allergies  Allergen Reactions   Penicillins Swelling     Past Medical History:  Diagnosis Date   Hypertension     Review of systems:  Otherwise negative.    Physical Exam  Gen: Alert, oriented. Appears stated age.  HEENT: [place]/AT. PERRLA. Lungs: CTA, no wheezes. CV: RR nl S1, S2. Abd: soft, benign, no masses. BS+ Ext: No edema.  Pulses 2+    Planned procedures: Proceed with colonoscopy. The patient understands the nature of the planned procedure, indications, risks, alternatives and potential complications including but not limited to bleeding, infection, perforation, damage to internal organs and possible oversedation/side effects from anesthesia. The patient agrees and gives consent to proceed.  Please refer to procedure notes for findings, recommendations and patient disposition/instructions.     [name] K. [name], M.D. Gastroenterology 01/12/2024  8:59 AM

## 2024-01-12 NOTE — Anesthesia Preprocedure Evaluation (Signed)
 Anesthesia Evaluation  Patient identified by MRN, date of birth, ID band Patient awake    Reviewed: Allergy & Precautions, H&P , NPO status , Patient's Chart, lab work & pertinent test results, reviewed documented beta blocker date and time   Airway Mallampati: II   Neck ROM: full    Dental  (+) Poor Dentition   Pulmonary neg pulmonary ROS, Current Smoker   Pulmonary exam normal        Cardiovascular Exercise Tolerance: Poor hypertension, On Medications negative cardio ROS Normal cardiovascular exam Rhythm:regular Rate:Normal     Neuro/Psych   Anxiety     negative neurological ROS  negative psych ROS   GI/Hepatic negative GI ROS, Neg liver ROS,,,  Endo/Other  negative endocrine ROS    Renal/GU negative Renal ROS  negative genitourinary   Musculoskeletal   Abdominal   Peds  Hematology negative hematology ROS (+)   Anesthesia Other Findings Past Medical History: No date: Hypertension History reviewed. No pertinent surgical history. BMI    Body Mass Index: 32.44 kg/m     Reproductive/Obstetrics negative OB ROS                              Anesthesia Physical Anesthesia Plan  ASA: 2  Anesthesia Plan: General   Post-op Pain Management:    Induction:   PONV Risk Score and Plan:   Airway Management Planned:   Additional Equipment:   Intra-op Plan:   Post-operative Plan:   Informed Consent: I have reviewed the patients History and Physical, chart, labs and discussed the procedure including the risks, benefits and alternatives for the proposed anesthesia with the patient or authorized representative who has indicated his/her understanding and acceptance.     Dental Advisory Given  Plan Discussed with: CRNA  Anesthesia Plan Comments:         Anesthesia Quick Evaluation

## 2024-01-13 LAB — SURGICAL PATHOLOGY

## 2024-01-25 ENCOUNTER — Other Ambulatory Visit: Payer: Self-pay | Admitting: Internal Medicine

## 2024-01-25 DIAGNOSIS — Z1231 Encounter for screening mammogram for malignant neoplasm of breast: Secondary | ICD-10-CM

## 2024-03-02 ENCOUNTER — Ambulatory Visit
Admission: RE | Admit: 2024-03-02 | Discharge: 2024-03-02 | Disposition: A | Discharge status: Home / Self Care | Source: Ambulatory Visit | Attending: Internal Medicine | Admitting: Internal Medicine

## 2024-03-02 DIAGNOSIS — Z1231 Encounter for screening mammogram for malignant neoplasm of breast: Secondary | ICD-10-CM | POA: Diagnosis present
